# Patient Record
Sex: Female | Born: 1955 | Race: Black or African American | Hispanic: No | Marital: Single | Smoking: Never smoker
Health system: Southern US, Community
[De-identification: ages and names within clinical notes are randomized; demographics above are authoritative.]

## PROBLEM LIST (undated history)

## (undated) DIAGNOSIS — M4802 Spinal stenosis, cervical region: Secondary | ICD-10-CM

## (undated) DIAGNOSIS — E119 Type 2 diabetes mellitus without complications: Secondary | ICD-10-CM

## (undated) DIAGNOSIS — I10 Essential (primary) hypertension: Secondary | ICD-10-CM

## (undated) DIAGNOSIS — M1990 Unspecified osteoarthritis, unspecified site: Secondary | ICD-10-CM

## (undated) DIAGNOSIS — M792 Neuralgia and neuritis, unspecified: Secondary | ICD-10-CM

---

## 2006-06-27 ENCOUNTER — Emergency Department (HOSPITAL_COMMUNITY): Admission: EM | Admit: 2006-06-27 | Discharge: 2006-06-27 | Payer: Self-pay | Admitting: Family Medicine

## 2014-08-26 ENCOUNTER — Other Ambulatory Visit: Payer: Self-pay | Admitting: Rheumatology

## 2014-08-26 DIAGNOSIS — D869 Sarcoidosis, unspecified: Secondary | ICD-10-CM

## 2014-08-28 ENCOUNTER — Inpatient Hospital Stay
Admission: RE | Admit: 2014-08-28 | Discharge: 2014-08-28 | Disposition: A | Discharge status: Home / Self Care | Payer: Self-pay | Source: Ambulatory Visit | Attending: Rheumatology | Admitting: Rheumatology

## 2014-09-03 ENCOUNTER — Ambulatory Visit
Admission: RE | Admit: 2014-09-03 | Discharge: 2014-09-03 | Disposition: A | Discharge status: Home / Self Care | Payer: Medicare Other | Source: Ambulatory Visit | Attending: Rheumatology | Admitting: Rheumatology

## 2014-09-03 DIAGNOSIS — D869 Sarcoidosis, unspecified: Secondary | ICD-10-CM

## 2014-09-08 ENCOUNTER — Other Ambulatory Visit: Payer: Self-pay | Admitting: Rheumatology

## 2014-09-08 DIAGNOSIS — D869 Sarcoidosis, unspecified: Secondary | ICD-10-CM

## 2014-10-20 ENCOUNTER — Encounter (HOSPITAL_COMMUNITY): Payer: Self-pay | Admitting: Emergency Medicine

## 2014-10-20 ENCOUNTER — Emergency Department (HOSPITAL_COMMUNITY): Payer: Medicare Other

## 2014-10-20 ENCOUNTER — Emergency Department (HOSPITAL_COMMUNITY)
Admission: EM | Admit: 2014-10-20 | Discharge: 2014-10-20 | Disposition: A | Discharge status: Home / Self Care | Payer: Medicare Other | Attending: Emergency Medicine | Admitting: Emergency Medicine

## 2014-10-20 DIAGNOSIS — W010XXD Fall on same level from slipping, tripping and stumbling without subsequent striking against object, subsequent encounter: Secondary | ICD-10-CM

## 2014-10-20 DIAGNOSIS — Z8739 Personal history of other diseases of the musculoskeletal system and connective tissue: Secondary | ICD-10-CM | POA: Diagnosis not present

## 2014-10-20 DIAGNOSIS — S79912D Unspecified injury of left hip, subsequent encounter: Secondary | ICD-10-CM | POA: Diagnosis not present

## 2014-10-20 DIAGNOSIS — I10 Essential (primary) hypertension: Secondary | ICD-10-CM | POA: Diagnosis not present

## 2014-10-20 DIAGNOSIS — M25552 Pain in left hip: Secondary | ICD-10-CM

## 2014-10-20 DIAGNOSIS — S3992XD Unspecified injury of lower back, subsequent encounter: Secondary | ICD-10-CM | POA: Insufficient documentation

## 2014-10-20 DIAGNOSIS — E119 Type 2 diabetes mellitus without complications: Secondary | ICD-10-CM | POA: Diagnosis not present

## 2014-10-20 DIAGNOSIS — M545 Low back pain, unspecified: Secondary | ICD-10-CM

## 2014-10-20 HISTORY — DX: Type 2 diabetes mellitus without complications: E11.9

## 2014-10-20 HISTORY — DX: Unspecified osteoarthritis, unspecified site: M19.90

## 2014-10-20 HISTORY — DX: Neuralgia and neuritis, unspecified: M79.2

## 2014-10-20 HISTORY — DX: Spinal stenosis, cervical region: M48.02

## 2014-10-20 HISTORY — DX: Essential (primary) hypertension: I10

## 2014-10-20 MED ORDER — HYDROCODONE-ACETAMINOPHEN 5-325 MG PO TABS
1.0000 | ORAL_TABLET | Freq: Once | ORAL | Status: AC
  Administered 2014-10-20: 1 via ORAL
  Filled 2014-10-20: qty 1

## 2014-10-20 NOTE — ED Notes (Signed)
Per EMS pt fell about a month ago, states no imaging done. Today complaining of lower left back pain radiating down into the left leg. Hx of cervical stenosis, arthritis, and leg neuropathy.

## 2014-10-20 NOTE — Discharge Instructions (Signed)
Be sure to take your pain medications as prescribed by your orthopedist.  Call to schedule a follow up appointment with your orthopedist for further evaluation and treatment.

## 2014-10-20 NOTE — ED Provider Notes (Signed)
CSN: [id number]     Arrival date & time 10/20/14  1405 History  This chart was scribed for Anita FinnerErin O'Malley, PA-C, working with Linwood DibblesJon Knapp, MD by Jolene Provostobert Halas, ED Scribe. This patient was seen in room WTR7/WTR7 and the patient's care was started at 2:27 PM.     Chief Complaint  Patient presents with  . Leg Pain  . Fall   HPI  HPI Comments: Anita ButterLoretta A Palmer is a 59 y.o. female with hx of arthritis who presents to the Emergency Department complaining of intermittently sharp and throbbing back pain that started one month ago. Pt states that she fell one month ago, and was not treated at the time. Pt states her pain has intermittently worsened since that time, and she states that the pain has began radiating down her legs yesterday. Pt states her pain is 7/10 at rest, but if she moves the wrong way her pain is a 10/10.  Pt states she is not able to put on clothes, shoes, and is not able to bend over due to pain. Pt states she does not have a hx of back surgery. Pt states she took 1/4 of a flexeril muscle relaxer before she came. Pt denies saddle numbness, or numbness, weakness or tingling in legs.  Pt states she recently received steroid injections in her bilateral hips, administered by her orthopedic doctor. Pt states [facility], but they were not able to see her until next Thursday which is why she reported to the ED.  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 Pt states she is allergic to percocet and demerol. Pt states she does not believe that she is allergic to oxycodone.    Past Medical History  Diagnosis Date  . Hypertension   . Diabetes mellitus without complication   . Arthritis   . Cervical stenosis of spine   . Neuropathic pain    History reviewed. No pertinent past surgical history. History reviewed. No pertinent family history. History  Substance Use Topics  . Smoking status: Not on file  . Smokeless tobacco: Not  on file  . Alcohol Use: Not on file   OB History    No data available     Review of Systems  Constitutional: Negative for fever and chills.  Gastrointestinal:       No saddle numbness.   Musculoskeletal: Positive for back pain and gait problem.  Skin: Negative for wound.  Neurological: Negative for weakness and numbness.  All other systems reviewed and are negative.   Allergies  Demerol and Percocet  Home Medications   Prior to Admission medications   Not on File   BP 120/69 mmHg  Pulse 87  Temp(Src) 98.9 F (37.2 C) (Oral)  Resp 18  SpO2 100% Physical Exam  Constitutional: She is oriented to person, place, and time. She appears well-developed and well-nourished. No distress.  HENT:  Head: Normocephalic and atraumatic.  Eyes: Pupils are equal, round, and reactive to light.  Neck: Neck supple.  Cardiovascular: Normal rate.   Pulmonary/Chest: Effort normal. No respiratory distress.  Musculoskeletal: Normal range of motion.  TTP along the left lumbar muscles, close to left hip. No midline spinal tenderness. Full ROM of the left hip but increased pain with ROM, radiating to lower back. Sensation to left leg intact.   Neurological: She is alert and oriented to person, place, and time. Coordination normal.  Antalgic gate uses cane for assistance.  Skin:  Skin is warm and dry. She is not diaphoretic.  Skin intact. No ecchymosis or erythema.   Psychiatric: She has a normal mood and affect. Her behavior is normal.  Nursing note and vitals reviewed.   ED Course  Procedures  DIAGNOSTIC STUDIES: Oxygen Saturation is 100% on RA, normal by my interpretation.    COORDINATION OF CARE: 2:35 PM Discussed treatment plan with pt at bedside and pt agreed to plan.  Labs Review Labs Reviewed - No data to display  Imaging Review Dg Hip Unilat With Pelvis 2-3 Views Left  10/20/2014   CLINICAL DATA:  Left hip pain.  No recent trauma  EXAM: LEFT HIP (WITH PELVIS) 2-3 VIEWS   COMPARISON:  None.  FINDINGS: Frontal pelvis as well as frontal and lateral left hip images were obtained. There is moderate symmetric narrowing of both hip joints. No fracture or dislocation. No erosive change. There are coils overlying the pelvis.  IMPRESSION: Symmetric narrowing both hip joints.  No fracture or dislocation   Electronically Signed   By: Bretta Bang III M.D.   On: 10/20/2014 15:33     EKG Interpretation None      MDM   Final diagnoses:  Left hip pain  Left low back pain  Fall from slip, trip, or stumble, subsequent encounter    Hx of arthritis. Worsening left lower back and hip pain. No red flag symptoms. Plain films negative for fracture or dislocation. Encouraged pt to f/u with PCP and orthopedist for further evaluation and treatment of pain. Advised pt she may try taking her full prescribed dose of flexeril and tramadol as she has only been taking 1/4th to 1/2 at a time. Home instructions provided. Return precautions provided. Pt verbalized understanding and agreement with tx plan.   I personally performed the services described in this documentation, which was scribed in my presence. The recorded information has been reviewed and is accurate.    Anita Finner, PA-C 10/20/14 2018  Juliet Rude. Rubin Payor, MD 10/21/14 [phone]

## 2014-11-03 ENCOUNTER — Ambulatory Visit
Admission: RE | Admit: 2014-11-03 | Discharge: 2014-11-03 | Disposition: A | Discharge status: Home / Self Care | Payer: Medicare Other | Source: Ambulatory Visit | Attending: Rheumatology | Admitting: Rheumatology

## 2014-11-03 ENCOUNTER — Other Ambulatory Visit: Payer: PRIVATE HEALTH INSURANCE

## 2014-11-03 DIAGNOSIS — D869 Sarcoidosis, unspecified: Secondary | ICD-10-CM

## 2014-11-03 MED ORDER — IOPAMIDOL (ISOVUE-300) INJECTION 61%
75.0000 mL | Freq: Once | INTRAVENOUS | Status: AC | PRN
  Administered 2014-11-03: 75 mL via INTRAVENOUS

## 2015-02-23 ENCOUNTER — Encounter (HOSPITAL_COMMUNITY): Payer: Self-pay | Admitting: Emergency Medicine

## 2015-02-23 ENCOUNTER — Emergency Department (HOSPITAL_COMMUNITY)
Admission: EM | Admit: 2015-02-23 | Discharge: 2015-02-23 | Disposition: A | Discharge status: Home / Self Care | Payer: Medicare Other | Attending: Emergency Medicine | Admitting: Emergency Medicine

## 2015-02-23 ENCOUNTER — Emergency Department (HOSPITAL_COMMUNITY): Payer: Medicare Other

## 2015-02-23 DIAGNOSIS — E119 Type 2 diabetes mellitus without complications: Secondary | ICD-10-CM | POA: Diagnosis not present

## 2015-02-23 DIAGNOSIS — M1990 Unspecified osteoarthritis, unspecified site: Secondary | ICD-10-CM | POA: Diagnosis not present

## 2015-02-23 DIAGNOSIS — R42 Dizziness and giddiness: Secondary | ICD-10-CM

## 2015-02-23 DIAGNOSIS — R110 Nausea: Secondary | ICD-10-CM | POA: Insufficient documentation

## 2015-02-23 DIAGNOSIS — R10811 Right upper quadrant abdominal tenderness: Secondary | ICD-10-CM

## 2015-02-23 DIAGNOSIS — I10 Essential (primary) hypertension: Secondary | ICD-10-CM | POA: Insufficient documentation

## 2015-02-23 DIAGNOSIS — R1011 Right upper quadrant pain: Secondary | ICD-10-CM | POA: Diagnosis not present

## 2015-02-23 DIAGNOSIS — Z79899 Other long term (current) drug therapy: Secondary | ICD-10-CM | POA: Insufficient documentation

## 2015-02-23 LAB — URINALYSIS, ROUTINE W REFLEX MICROSCOPIC
Bilirubin Urine: NEGATIVE
Glucose, UA: NEGATIVE mg/dL
Hgb urine dipstick: NEGATIVE
KETONES UR: NEGATIVE mg/dL
LEUKOCYTES UA: NEGATIVE
Nitrite: NEGATIVE
PROTEIN: NEGATIVE mg/dL
Specific Gravity, Urine: 1.007 (ref 1.005–1.030)
Urobilinogen, UA: 1 mg/dL (ref 0.0–1.0)
pH: 7 (ref 5.0–8.0)

## 2015-02-23 LAB — CBC WITH DIFFERENTIAL/PLATELET
BASOS ABS: 0 10*3/uL (ref 0.0–0.1)
Basophils Relative: 0 % (ref 0–1)
EOS PCT: 1 % (ref 0–5)
Eosinophils Absolute: 0 10*3/uL (ref 0.0–0.7)
HCT: 36.3 % (ref 36.0–46.0)
Hemoglobin: 12.1 g/dL (ref 12.0–15.0)
LYMPHS PCT: 22 % (ref 12–46)
Lymphs Abs: 0.8 10*3/uL (ref 0.7–4.0)
MCH: 28.7 pg (ref 26.0–34.0)
MCHC: 33.3 g/dL (ref 30.0–36.0)
MCV: 86.2 fL (ref 78.0–100.0)
MONO ABS: 0.3 10*3/uL (ref 0.1–1.0)
Monocytes Relative: 8 % (ref 3–12)
NEUTROS ABS: 2.7 10*3/uL (ref 1.7–7.7)
Neutrophils Relative %: 69 % (ref 43–77)
PLATELETS: 260 10*3/uL (ref 150–400)
RBC: 4.21 MIL/uL (ref 3.87–5.11)
RDW: 14.1 % (ref 11.5–15.5)
WBC: 3.8 10*3/uL — ABNORMAL LOW (ref 4.0–10.5)

## 2015-02-23 LAB — I-STAT TROPONIN, ED: Troponin i, poc: 0 ng/mL (ref 0.00–0.08)

## 2015-02-23 LAB — COMPREHENSIVE METABOLIC PANEL
ALBUMIN: 3.8 g/dL (ref 3.5–5.0)
ALT: 13 U/L — ABNORMAL LOW (ref 14–54)
ANION GAP: 12 (ref 5–15)
AST: 23 U/L (ref 15–41)
Alkaline Phosphatase: 55 U/L (ref 38–126)
BILIRUBIN TOTAL: 0.6 mg/dL (ref 0.3–1.2)
BUN: 10 mg/dL (ref 6–20)
CALCIUM: 8.6 mg/dL — AB (ref 8.9–10.3)
CHLORIDE: 99 mmol/L — AB (ref 101–111)
CO2: 23 mmol/L (ref 22–32)
CREATININE: 0.83 mg/dL (ref 0.44–1.00)
GFR calc Af Amer: >60 mL/min (ref >60)
GFR calc non Af Amer: >60 mL/min (ref >60)
Glucose, Bld: 129 mg/dL — ABNORMAL HIGH (ref 65–99)
Potassium: 3.5 mmol/L (ref 3.5–5.1)
Sodium: 134 mmol/L — ABNORMAL LOW (ref 135–145)
TOTAL PROTEIN: 6.5 g/dL (ref 6.5–8.1)

## 2015-02-23 LAB — LIPASE, BLOOD: Lipase: 16 U/L — ABNORMAL LOW (ref 22–51)

## 2015-02-23 MED ORDER — ACETAMINOPHEN 325 MG PO TABS
650.0000 mg | ORAL_TABLET | Freq: Once | ORAL | Status: AC
  Administered 2015-02-23: 650 mg via ORAL
  Filled 2015-02-23: qty 2

## 2015-02-23 MED ORDER — SODIUM CHLORIDE 0.9 % IV BOLUS (SEPSIS)
1000.0000 mL | Freq: Once | INTRAVENOUS | Status: AC
  Administered 2015-02-23: 1000 mL via INTRAVENOUS

## 2015-02-23 NOTE — ED Notes (Signed)
MD at bedside. 

## 2015-02-23 NOTE — Discharge Instructions (Signed)
Return to the emergency room with worsening of symptoms, new symptoms or with symptoms that are concerning , especially severe worsening of headache, visual or speech changes, weakness in face, arms or legs. Drink plenty of fluids, 8 (8oz glasses daily). Read below information and follow recommendations. Please call your doctor for a followup appointment within 24-48 hours. When you talk to your doctor please let them know that you were seen in the emergency department and have them acquire all of your records so that they can discuss the findings with you and formulate a treatment plan to fully care for your new and ongoing problems. If you do not have a primary care provider please call the number below under ED resources to establish care with a provider and follow up.   Orthostatic Hypotension Orthostatic hypotension is a sudden drop in blood pressure. It happens when you quickly stand up from a seated or lying position. You may feel dizzy or light-headed. This can last for just a few seconds or for up to a few minutes. It is usually not a serious problem. However, if this happens frequently or gets worse, it can be a sign of something more serious. CAUSES  Different things can cause orthostatic hypotension, including:   Loss of body fluids (dehydration).  Medicines that lower blood pressure.  Sudden changes in posture, such as standing up quickly after you have been sitting or lying down.  Taking too much of your medicine. SIGNS AND SYMPTOMS   Light-headedness or dizziness.   Fainting or near-fainting.   A fast heart rate.   Weakness.   Feeling tired (fatigue).  DIAGNOSIS  Your health care provider may do several things to help diagnose your condition and identify the cause. These may include:   Taking a medical history and doing a physical exam.  Checking your blood pressure. Your health care provider will check your blood pressure when you are:  Lying  down.  Sitting.  Standing.  Using tilt table testing. In this test, you lie down on a table that moves from a lying position to a standing position. You will be strapped onto the table. This test monitors your blood pressure and heart rate when you are in different positions. TREATMENT  Treatment will vary depending on the cause. Possible treatments include:   Changing the dosage of your medicines.  Wearing compression stockings on your lower legs.  Standing up slowly after sitting or lying down.  Eating more salt.  Eating frequent, small meals.  In some cases, getting IV fluids.  Taking medicine to enhance fluid retention. HOME CARE INSTRUCTIONS  Only take over-the-counter or prescription medicines as directed by your health care provider.  Follow your health care provider's instructions for changing the dosage of your current medicines.  Do not stop or adjust your medicine on your own.  Stand up slowly after sitting or lying down. This allows your body to adjust to the different position.  Wear compression stockings as directed.  Eat extra salt as directed.  Do not add extra salt to your diet unless directed to by your health care provider.  Eat frequent, small meals.  Avoid standing suddenly after eating.  Avoid hot showers or excessive heat as directed by your health care provider.  Keep all follow-up appointments. SEEK MEDICAL CARE IF:  You continue to feel dizzy or light-headed after standing.  You feel groggy or confused.  You feel cold, clammy, or sick to your stomach (nauseous).  You have blurred vision.  You feel short of breath. SEEK IMMEDIATE MEDICAL CARE IF:   You faint after standing.  You have chest pain.  You have difficulty breathing.   You lose feeling or movement in your arms or legs.   You have slurred speech or difficulty talking, or you are unable to talk.  MAKE SURE YOU:   Understand these instructions.  Will watch  your condition.  Will get help right away if you are not doing well or get worse. Document Released: 08/05/2002 Document Revised: 08/20/2013 Document Reviewed: 06/07/2013 [facility] Patient Information 2015 [place], [place]. This information is not intended to replace advice given to you by your health care provider. Make sure you discuss any questions you have with your health care provider.    Emergency Department Resource Guide 1) Find a Doctor and Pay Out of Pocket Although you won't have to find out who is covered by your insurance plan, it is a good idea to ask around and get recommendations. You will then need to call the office and see if the doctor you have chosen will accept you as a new patient and what types of options they offer for patients who are self-pay. Some doctors offer discounts or will set up payment plans for their patients who do not have insurance, but you will need to ask so you aren't surprised when you get to your appointment.  2) Contact Your Local Health Department Not all health departments have doctors that can see patients for sick visits, but many do, so it is worth a call to see if yours does. If you don't know where your local health department is, you can check in your phone book. The CDC also has a tool to help you locate your state's health department, and many state websites also have listings of all of their local health departments.  3) Find a Walk-in Clinic If your illness is not likely to be very severe or complicated, you may want to try a walk in clinic. These are popping up all over the country in pharmacies, drugstores, and shopping centers. They're usually staffed by nurse practitioners or physician assistants that have been trained to treat common illnesses and complaints. They're usually fairly quick and inexpensive. However, if you have serious medical issues or chronic medical problems, these are probably not your best option.  No Primary Care  Doctor: - Call Health Connect at  [phone] - they can help you locate a primary care doctor that  accepts your insurance, provides certain services, etc. - Physician Referral Service- [phone]  Chronic Pain Problems: Organization         Address  Phone   Notes  [facility]  [phone] Patients need to be referred by their primary care doctor.   Medication Assistance: Organization         Address  Phone   Notes  [facility] Medication [facility] [address] [place], [place] [address] [phone] --Must be a resident of [facility] -- Must have NO insurance coverage whatsoever (no Medicaid/ Medicare, etc.) -- The pt. MUST have a primary care doctor that directs their care regularly and follows them in the community   Med[facility]ist  [phone]   [employer]  [phone]    Agencies that provide inexpensive medical care: Organization         Address  Phone   Notes  [facility]  [phone]   [facility]    (  [phone]   [facility] [place], [place] [address] [phone]   [place] [address], [place] [phone]   Planned Parenthood    [phone]   [facility]    [phone]   [place] and [address], [place] Phone:  [phone], Fax:  [phone] Hours of Operation:  9 am - 6 pm, M-F.  Also accepts Medicaid/Medicare and self-pay.  [facility] for [place], [address], [place] Phone: [phone], Fax: [phone]. Hours of Operation:  8:30 am - 5:30 pm, M-F.  Also accepts Medicaid and self-pay.  [facility] [place] [address], [place] Phone: [phone]   [place], [place], [place] [phone], Ext. [phone] Mondays & Thursdays: 7-9 AM.  First 15 patients are seen on a first  come, first serve basis.    [place] Providers:  Organization         Address  Phone   Notes  [facility] [address], [place] [phone] Also accepts self-pay patients.  [facility] [address], [place]  [phone]   [place], [address], [place] [phone]   [facility] [address], [place] [phone]   Lucianne Lei [address], [place]   [phone] Only accepts [place] Access [place] patients after they have their name applied to their card.   Self-Pay (no insurance) in [facility]:  Organization         Address  Phone   Notes  Sickle Cell Patients, [facility] Internal Medicine [place] [phone]   [facility] [place] [phone]   [facility] [place]  [place], [place], [place] [phone]   [facility]/Dr. Osei-Bonsu  [address] [place] or [address], [place] [phone] Phone number for both [place] and [place] locations is the same.  Urgent Medical and [facility] [address], [place] [phone]   [facility] [address], [place] or [address] [phone]   [facility] [address], [place] [phone], phone; [phone], fax Sees patients 1st and 3rd Saturday of every month.  Must not qualify for public or private insurance (i.e. Medicaid, Medicare, [place] Health Choice, Veterans' Benefits)  Household income should be no more than 200% of the poverty level The clinic cannot treat you if you are pregnant or think you are pregnant  Sexually transmitted diseases are not treated at the clinic.    Dental Care: Organization          Address  Phone  Notes  [facility] Department of [facility] [phone] Accepts children up to age 48 who are enrolled in [place] or [place]; pregnant women with a Medicaid card; and children who have applied for Medicaid or [place] Health Choice, but were declined, whose parents can pay a reduced fee at time of service.  [facility] Department of [facility]  [address], [place] [phone] Accepts children up  to age 63 who are enrolled in Medicaid or [place] Health Choice; pregnant women with a Medicaid card; and children who have applied for Medicaid or  Health Choice, but were declined, whose parents can pay a reduced fee at time of service.  [facility] Adult [facility] Access PROGRAM  [place] [phone] Patients are seen by appointment only. Walk-ins are not accepted. [place] will see patients 68 years of age and older. Monday - Tuesday (8am-5pm) Most Wednesdays (8:30-5pm) $30 per visit, cash only  [facility] Access PRO[facility]  [address], [facility] [phone] Patients are seen by appointment only. Walk-ins are not accepted. [place] will see patients 58 years of age and older. One Wednesday Evening (Monthly: Volunteer Based).  $30 per visit, cash only  [place]  [phone] for adults; Children under age 51, call Graduate Pediatric Dentistry at [phone]. Children aged 32-14, please call [phone] to request a pediatric application.  Dental services are provided in all areas of dental care including fillings, crowns and bridges, complete and partial dentures, implants, gum treatment, root canals, and extractions. Preventive care is also provided. Treatment is provided to both adults and children. Patients are selected via a lottery and there is often a waiting list.   [facility] [address] [place]  [phone] [email or web address]   [facility] [address] [place], [place] [phone], Ext. [phone] Second and Fourth Thursday of each month, opens at 6:30 AM; Clinic ends at 9 AM.  Patients are seen on a first-come first-served basis, and a limited number are seen during each clinic.   [facility]  [address] Hillard Danker Ferrer [place], [place] [phone]   Eligibility Requirements You must have lived in [place], [place], or [place] counties for at least the last three months.   You cannot be eligible for state or federal sponsored [employer], including [employer], [place], or [employer].   You generally cannot be eligible for healthcare insurance through your employer.    How to apply: Eligibility screenings are held every Tuesday and Wednesday afternoon from 1:00 pm until 4:00 pm. You do not need an appointment for the interview!  [facility] [address] [place], [place]   [place]  [place] Department  [place]  [phone]    Behavioral Health Resources in the Community: Intensive Outpatient Programs Organization         Address  Phone  Notes  [place]. [address] [place], [place] [phone]   [facility] Outpatient [address] [place], [place]   ADS: Alcohol & Drug Svcs [address], [place], [place]   [place] [address],  [place], [place] or [phone]   Substance Abuse Resources Organization         Address  Phone  Notes  Alcohol and Drug Services  [phone]   [place]  [phone]   The [place]   Chinita Pester  [phone]   Residential & Outpatient Substance Abuse Program  6120253712   Psychological  Services Organization         Address  Phone  Notes  [place]  [place]  [place]   [place]  [address], [place] or [phone]    Mobile Crisis Teams Organization         Address  Phone  Notes  [facility]atives, Mobile Crisis Care Unit  [phone]   Assertive Psychotherapeutic Services  [address] [place], [place]   [place] Levels [address] [place] [phone]    Self-Help/Support Groups Organization         Address  Phone             Notes  [place]. of [place] - variety of support groups  [place] Call for more information  Narcotics Anonymous (NA), [facility]aring [facility] [address], [employer] [place]  2 meetings at this location   Special educational needs teacher         Address  Phone  Notes  ASAP Residential Treatment [place],    [place]  [phone]   [facility]  [address], [place] [address], [place], [place]   [place], [place] [phone] Admissions: 8am-3pm M-F  Incentives Substance [place] [address]    [place], [place] [phone]   [facility] [address] [place], [place], [place]   [facility] [place] [address]  [place], [place]   [facility]ight Programs - Intensive Outpatient [address] Kristeen [address], [place], [place]   [facility] ([place] [place].,  [place], [place] [phone] or [phone]   [facility] Services (RTS) [address] [place], [place] Accepts Medicaid  [facility] [place] [address],  [place] [phone] Substance Abuse/Addiction Treatment   [facility] Organization         Address  Phone  Notes  [facility]  [phone]   Domenic Schwab, PhD [address] Arlis Porta [place], [place]   [phone] or [phone]   [place], [place] [phone]   [facility] [address] [place], [place] [phone] Insurance/Medicaid/sponsorship through [facility] and Families [address] [place]                                    [place], [place] [phone] [place] [address], [place] [phone]    Dr. Adele Schilder  [phone]   [facility] Dept. 1) [address], [place] 2) [place] 3)  [place] [address], [place] [phone]  [phone]   [place] [phone] or [phone] (After Hours)

## 2015-02-23 NOTE — ED Notes (Signed)
Pt c/o dizziness with movement, "any type of movement of head"

## 2015-02-23 NOTE — ED Provider Notes (Signed)
Medical screening examination/treatment/procedure(s) were conducted as a shared visit with non-physician practitioner(s) and myself.  I personally evaluated the patient during the encounter.   EKG Interpretation   Date/Time:  Monday February 23 2015 18:55:59 EDT Ventricular Rate:  76 PR Interval:  179 QRS Duration: 84 QT Interval:  415 QTC Calculation: 467 R Axis:   30 Text Interpretation:  Sinus rhythm Confirmed by Freida BusmanALLEN  MD, Lakea Mittelman ([id number])  on 02/23/2015 7:10:31 PM     Patient here with sudden onset of dizziness prior to arrival that is worse with standing. She does take a diabetic Given IV fluids here and feels better. Stable for discharge  Lorre NickAnthony Mechelle Pates, MD 02/23/15 2252

## 2015-02-23 NOTE — ED Notes (Signed)
Pt arrived via EMS where she has nausea with some abd pain. Denies any CP or SOB.

## 2015-02-23 NOTE — ED Provider Notes (Signed)
CSN: [id number]     Arrival date & time 02/23/15  1632 History   First MD Initiated Contact with Patient 02/23/15 1745     Chief Complaint  Patient presents with  . Dizziness  . Abdominal Pain     (Consider location/radiation/quality/duration/timing/severity/associated sxs/prior Treatment) HPI  Anita Palmer is a 59 y.o. female with PMH of HTN, DM, arthritis, neuropathic pain presenting with one-day history of lightheadedness with standing with associated nausea without emesis. Pt endorsed near syncopal episode due to the lightheadedness. She denies LOC or head injury. Pt denies headache, weakness. Patient did endorse associated blurred vision for near syncope has resolved and denies subsequent episodes. Patient denies chest pain, shortness of breath, abdominal pain, changes in medications. She has not taken anything for her symptoms. She denies fevers, chills, recent illnesses. She denies edema. Patient endorses decreased by mouth intake today.   Past Medical History  Diagnosis Date  . Hypertension   . Diabetes mellitus without complication   . Arthritis   . Cervical stenosis of spine   . Neuropathic pain    No past surgical history on file. No family history on file. History  Substance Use Topics  . Smoking status: Never Smoker   . Smokeless tobacco: Never Used  . Alcohol Use: No   OB History    No data available     Review of Systems 10 Systems reviewed and are negative for acute change except as noted in the HPI.    Allergies  Demerol and Percocet  Home Medications   Prior to Admission medications   Medication Sig Start Date End Date Taking? Authorizing Provider  citalopram (CELEXA) 40 MG tablet Take 40 mg by mouth daily.   Yes Historical Provider, MD  esomeprazole (NEXIUM) 40 MG capsule Take 40 mg by mouth daily at 12 noon.   Yes Historical Provider, MD  gabapentin (NEURONTIN) 400 MG capsule Take 400 mg by mouth 3 (three) times daily.   Yes Historical  Provider, MD  Magnesium 250 MG TABS Take 1 tablet by mouth daily at 8 pm.   Yes Historical Provider, MD  metFORMIN (GLUCOPHAGE) 500 MG tablet Take 1,000 mg by mouth 2 (two) times daily with a meal.   Yes Historical Provider, MD  Multiple Vitamin (MULTIVITAMIN) tablet Take 1 tablet by mouth daily.   Yes Historical Provider, MD  traMADol (ULTRAM) 50 MG tablet Take by mouth every 6 (six) hours as needed for moderate pain.   Yes Historical Provider, MD  triamterene-hydrochlorothiazide (MAXZIDE-25) 37.5-25 MG per tablet Take 1 tablet by mouth daily.   Yes Historical Provider, MD   BP 132/74 mmHg  Pulse 75  Temp(Src) 97.9 F (36.6 C) (Oral)  Resp 14  SpO2 100% Physical Exam  Constitutional: She appears well-developed and well-nourished. No distress.  HENT:  Head: Normocephalic and atraumatic.  Mouth/Throat: Oropharynx is clear and moist.  Eyes: Conjunctivae and EOM are normal. Pupils are equal, round, and reactive to light. Right eye exhibits no discharge. Left eye exhibits no discharge.  Neck: Normal range of motion. Neck supple.  No nuchal rigidity  Cardiovascular: Normal rate and regular rhythm.   Pulmonary/Chest: Effort normal and breath sounds normal. No respiratory distress. She has no wheezes.  Abdominal: Soft. Bowel sounds are normal. She exhibits no distension. There is no tenderness.  Neurological: She is alert. No cranial nerve deficit. Coordination normal.  Speech is clear and goal oriented. Peripheral visual fields intact. Strength 5/5 in upper and lower extremities. Sensation intact. Intact rapid  alternating movements, finger to nose, and heel to shin. Negative Romberg. No pronator drift. Normal gait.   Skin: Skin is warm and dry. She is not diaphoretic.  Nursing note and vitals reviewed.   ED Course  Procedures (including critical care time) Labs Review Labs Reviewed  CBC WITH DIFFERENTIAL/PLATELET - Abnormal; Notable for the following:    WBC 3.8 (*)    All other  components within normal limits  COMPREHENSIVE METABOLIC PANEL - Abnormal; Notable for the following:    Sodium 134 (*)    Chloride 99 (*)    Glucose, Bld 129 (*)    Calcium 8.6 (*)    ALT 13 (*)    All other components within normal limits  LIPASE, BLOOD - Abnormal; Notable for the following:    Lipase 16 (*)    All other components within normal limits  URINALYSIS, ROUTINE W REFLEX MICROSCOPIC (NOT AT [facility])  Rosezena Sensor, ED    Imaging Review US Abdomen Limited Ruq  02/23/2015   CLINICAL DATA:  Right upper quadrant abdominal tenderness.  EXAM: US ABDOMEN LIMITED - RIGHT UPPER QUADRANT  COMPARISON:  None.  FINDINGS: Gallbladder:  No gallstones or wall thickening visualized. No sonographic Murphy sign noted.  Common bile duct:  Diameter: 2.7-2.9 mm.  Liver:  No focal lesion identified. Mild diffuse increase in parenchymal echogenicity. Normal directional flow in the main portal vein.  IMPRESSION: 1. Mild diffuse increase in hepatic echogenicity, likely mild steatosis. 2. Normal sonographic appearance of gallbladder and biliary tree.   Electronically Signed   By: Rubye Oaks M.D.   On: 02/23/2015 20:02     EKG Interpretation   Date/Time:  Monday February 23 2015 18:55:59 EDT Ventricular Rate:  76 PR Interval:  179 QRS Duration: 84 QT Interval:  415 QTC Calculation: 467 R Axis:   30 Text Interpretation:  Sinus rhythm Confirmed by Freida Busman  MD, ANTHONY ([id number])  on 02/23/2015 7:10:31 PM      MDM   Final diagnoses:  RUQ abdominal tenderness  Postural lightheadedness   Lightheadedness is transient, worsened with movement/standing, and abrupt in onset.  No HA, visual or speech changes, or weakness.  Pt without  Afib or prior CVA. VSS. Patient with completely benign neurological exam. Patient not orthostatic. Pt with evidence of dehydration and electrolyte changes consistent with diuretic use. Pt given IVF with significant improvement of her symptoms. Patient is afebrile, nontoxic,  and in no acute distress. Patient is appropriate for outpatient management and is stable for discharge. Patient to follow up with PCP as scheduled July 7th.  Discussed return precautions with patient. Discussed all results and patient verbalizes understanding and agrees with plan.   This is a shared patient. This patient was discussed with the physician who saw and evaluated the patient and agrees with the plan.     Oswaldo Conroy, PA-C 02/24/15 1258

## 2015-02-23 NOTE — Progress Notes (Signed)
EDCM spoke to patient at bedside.  Patient confirms she has Medicare insurance without a pcp.  Patient also requesting to speak to a social worker because she is homeless.  [facility] consulted EDSW to speak to patient.   [facility] provided patient with pamphlet to [facility], informed patient of services there and walk in times.  EDCM also provided patient with list of pcps who accept self pay patients, list of discount pharmacies and websites needymeds.org and GoodRX.com for medication assistance, phone number to inquire about the orange card, phone number to inquire about Mediciad, phone number to inquire about the Affordable Care Act, financial resources in the community such as local churches, [employer], urban ministries, and dental assistance for uninsured patients.  [facility] provided patient with phone number and address to [facility].  Patient reports she receives her medications through mail order and delivered to a p.o. Box.  Patient reports she does nto have any difficulty obtaining her medications. Patient thankfulf or resources.  No further EDCM needs at this time.

## 2015-02-23 NOTE — Progress Notes (Signed)
CSW spoke with patient at bedside and offered resources regarding shelters and food pantries. The patient accepted.   Patient informed CSW that she is currently living with her friend. She states that she has been there since last Friday and can stay until this Friday.  Prior to living with her friend, patient states that she was staying in a hotel but ran out of money. The patient informed CSW that the majority of her family lives in [place]. She states that her family and friends are supportive but do not have a place for her to live.  Patient states that she has 2 cats that she does not want to give up and would like to take them with her when she finds shelter. She states that she has had her cats for 5 years.  Patient informed CSW that she receives disability as a source of income.   Patient states that she can complete her ADL's independently and that she does not fall often.   Trish Mage [phone] ED CSW 02/23/2015 9:41 PM

## 2015-02-23 NOTE — ED Notes (Signed)
Pt states she is not as dizzy, now that fluids are going in.

## 2015-02-23 NOTE — Progress Notes (Signed)
CSW was notified by Nurse CM that the pt is possibly homeless.   CSW will meet with patient at bedside and offer her shelter and food pantry resources.  Trish MageBrittney Ruhi Kopke, LCSWA [phone] ED CSW 02/23/2015 6:17 PM

## 2015-02-23 NOTE — ED Notes (Signed)
Bed: WA06 Expected date:  Expected time:  Means of arrival:  Comments: No bed

## 2016-09-25 IMAGING — CT CT CHEST W/ CM
1 of 4 series · 15 of 30 positions shown, 19 images · IV contrast (75CC ISOVUE 300)
Comparison: Chest CT 09/03/2014.

CLINICAL DATA: 59-year-old female with abnormal chest CT and
sarcoidosis. Subsequent encounter.

EXAM:
CT CHEST WITH CONTRAST
TECHNIQUE: Multidetector CT imaging of the chest was performed during
intravenous contrast administration.
CONTRAST:  75 mL Qsovue-2PP

[Series 2: thin/super d · axial · 0.70mm/px · z∈[-344,-64]mm · 15 of 320 slices shown, 19 images]
[im 20/320  mediastinal]
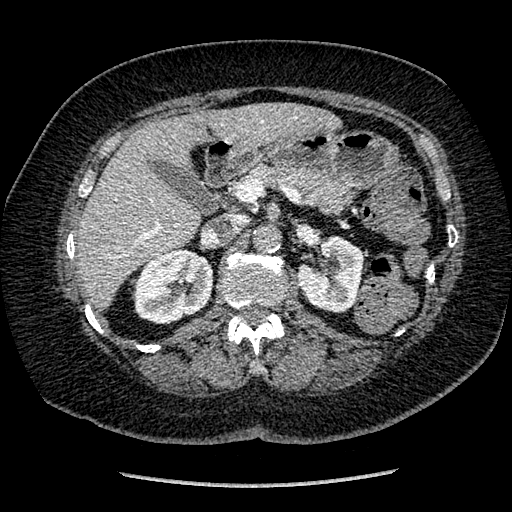
[im 20/320  lung]
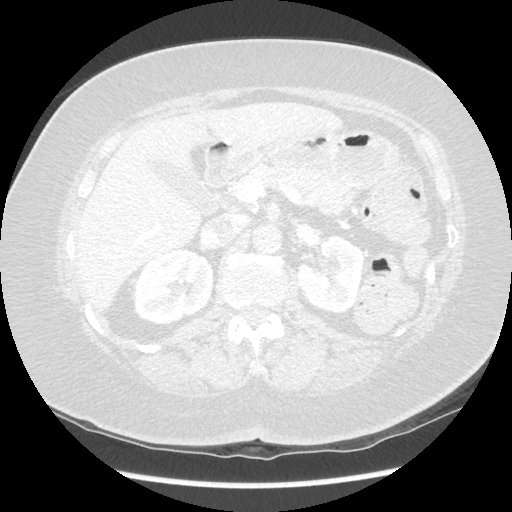
[im 40/320  lung]
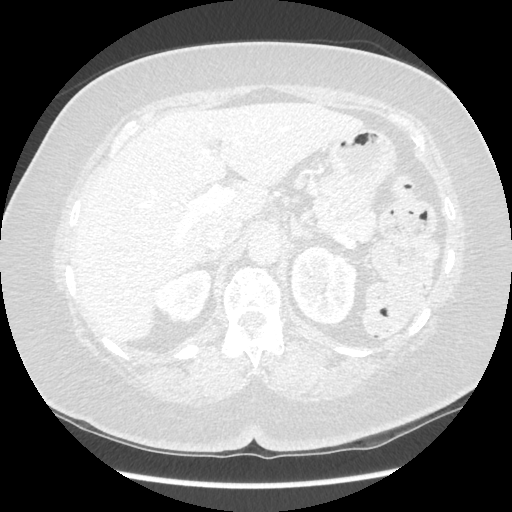
[im 60/320  lung]
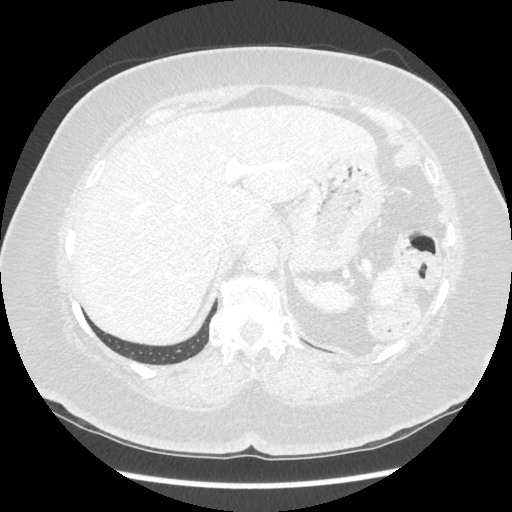
[im 80/320  lung]
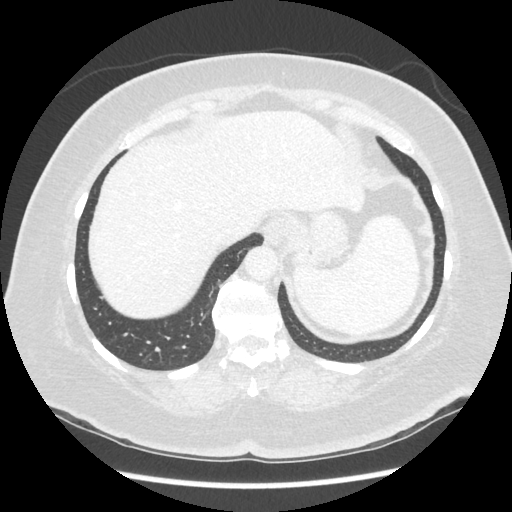
[im 100/320  mediastinal]
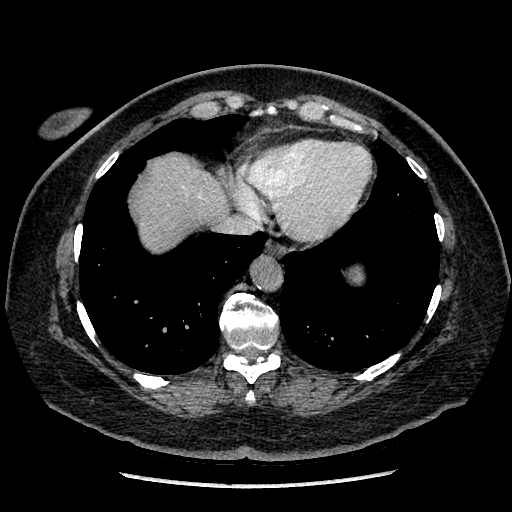
[im 100/320  lung]
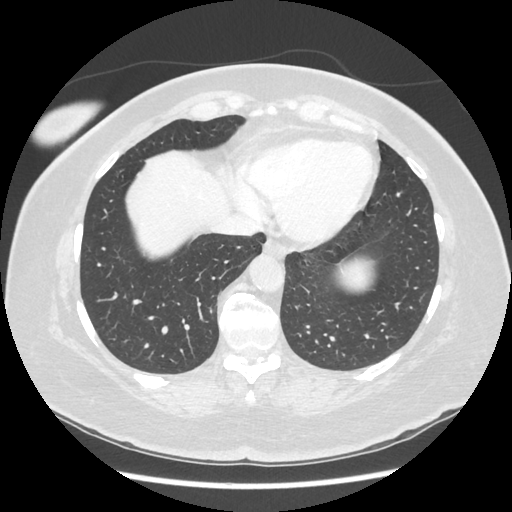
[im 120/320  lung]
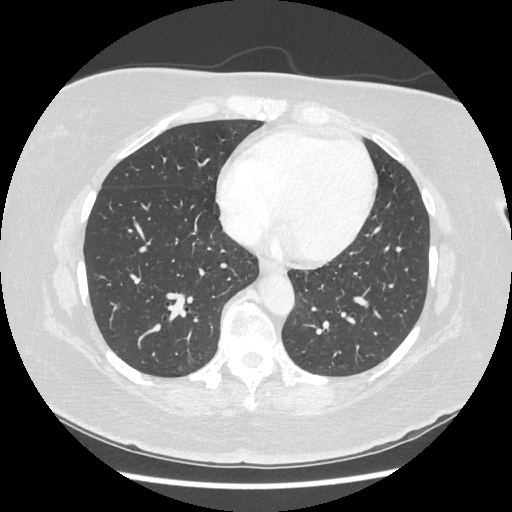
[im 140/320  lung]
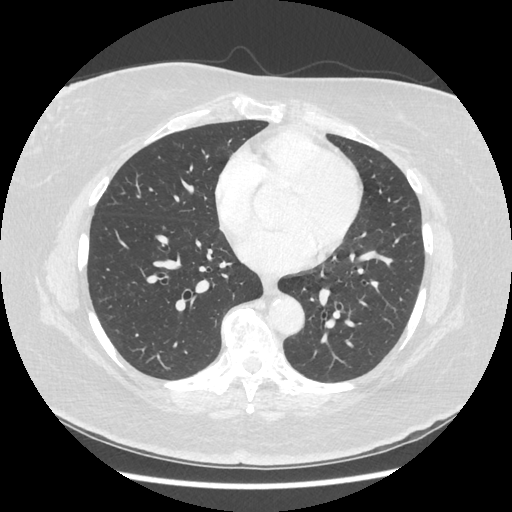
[im 160/320  lung]
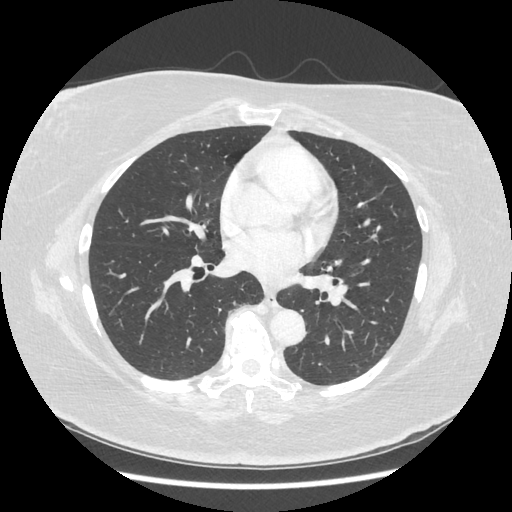
[im 180/320  mediastinal]
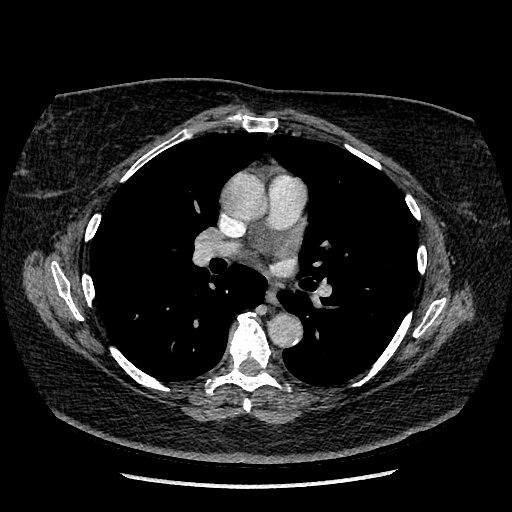
[im 180/320  lung]
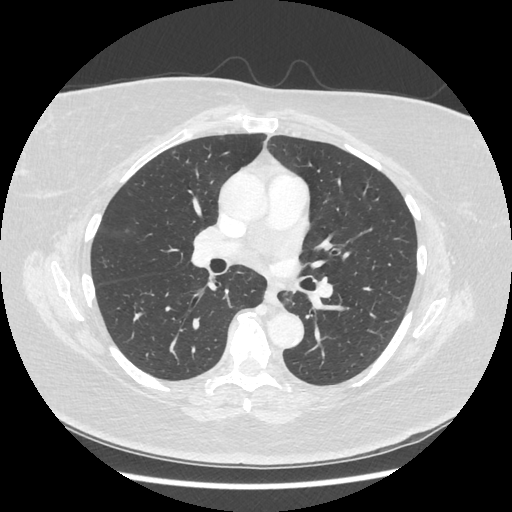
[im 200/320  lung]
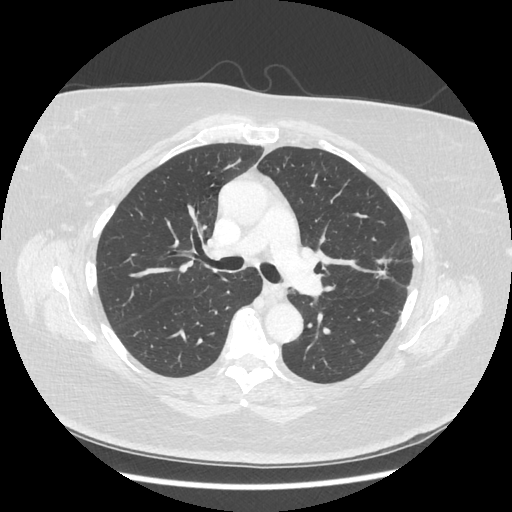
[im 220/320  lung]
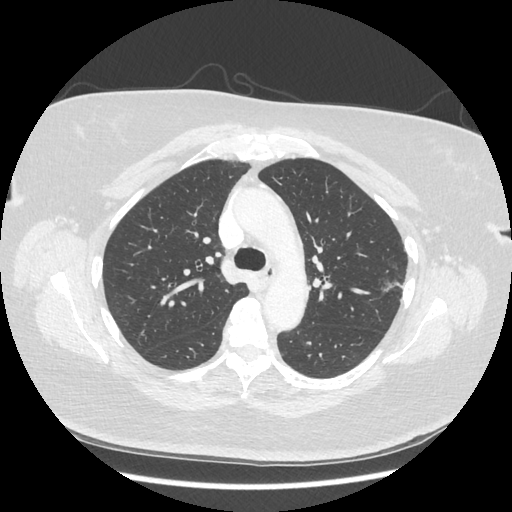
[im 240/320  lung]
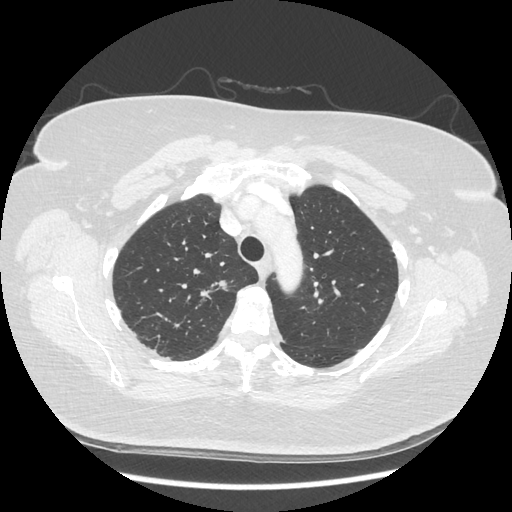
[im 260/320  mediastinal]
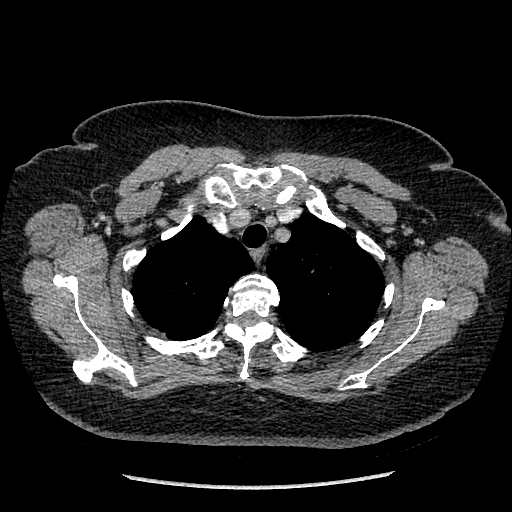
[im 260/320  lung]
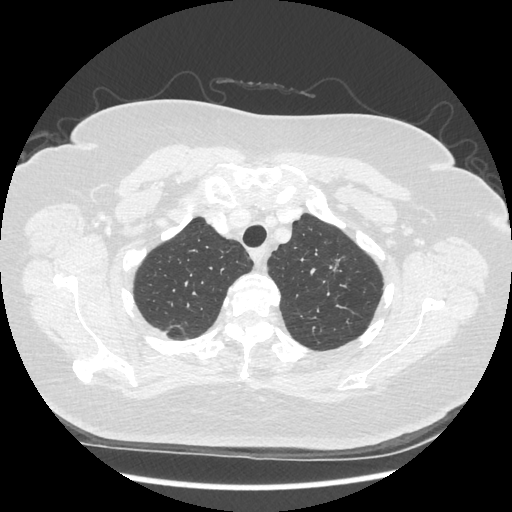
[im 280/320  lung]
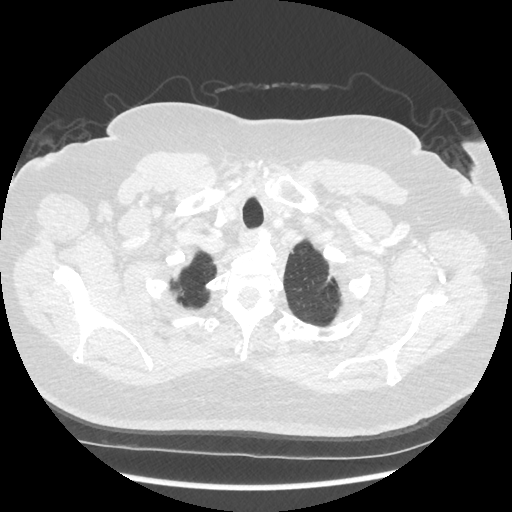
[im 300/320  lung]
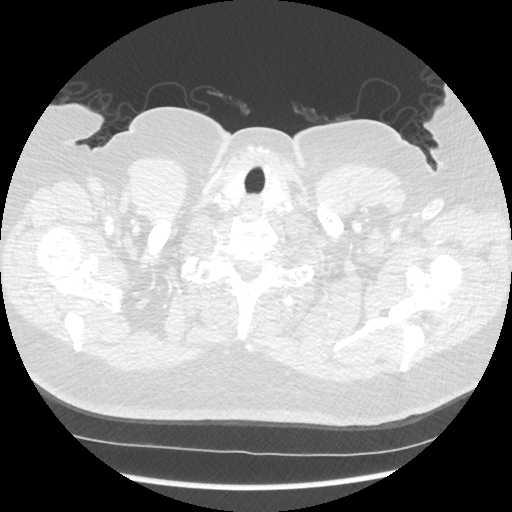

[15 of 30 positions shown; findings below may reference images not displayed]

FINDINGS: Stable lung volumes. Major airways are stable and patent. Apical and
bilateral upper lobe perihilar irregular pulmonary opacities are
stable to mildly regressed since 09/03/2014. No new or progressed
pulmonary lesions are identified. No pleural effusion.

No pericardial effusion. Negative thoracic inlet. No mediastinal or
hilar lymphadenopathy. Major mediastinal vascular structures are
within normal limits; there is calcified plaque of the aorta.

Negative visualized liver, gallbladder, spleen, pancreas, adrenal
glands, and kidneys.

No acute osseous abnormality identified.
IMPRESSION: Short interval stable CT appearance of the chest since August 2014.

Suspect inflammatory etiology for the upper lobe peribronchovascular
opacities, such as sarcoid, as previously described.
